# Patient Record
Sex: Male | Born: 1955 | Race: Black or African American | Hispanic: No | Marital: Married | Smoking: Never smoker
Health system: Southern US, Community
[De-identification: ages and names within clinical notes are randomized; demographics above are authoritative.]

## PROBLEM LIST (undated history)

## (undated) DIAGNOSIS — Z789 Other specified health status: Secondary | ICD-10-CM

## (undated) HISTORY — PX: NO PAST SURGERIES: SHX2092

---

## 2008-06-13 ENCOUNTER — Other Ambulatory Visit: Payer: Self-pay

## 2008-06-14 ENCOUNTER — Other Ambulatory Visit: Payer: Self-pay

## 2008-06-14 ENCOUNTER — Observation Stay: Payer: Self-pay | Admitting: Internal Medicine

## 2008-12-25 IMAGING — US US RENAL KIDNEY
1 series · 17 of 25 positions shown · non-contrast
Comparison: none

REASON FOR EXAM: erythrocytosis
COMMENTS:

[Series 1: us renal kidney · 17 of 25 slices shown]
[im 1/25]
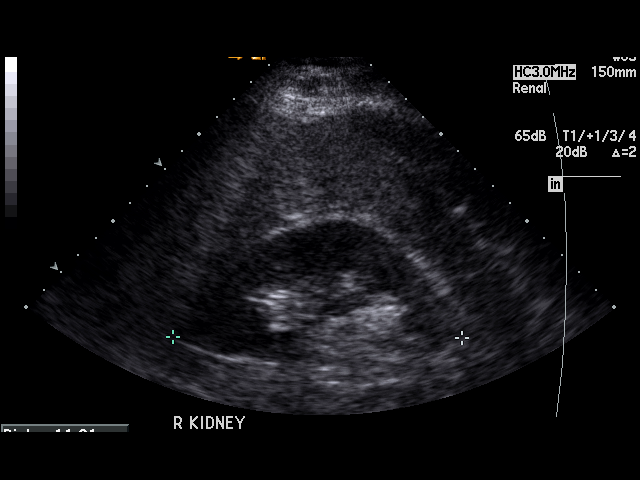
[im 3/25]
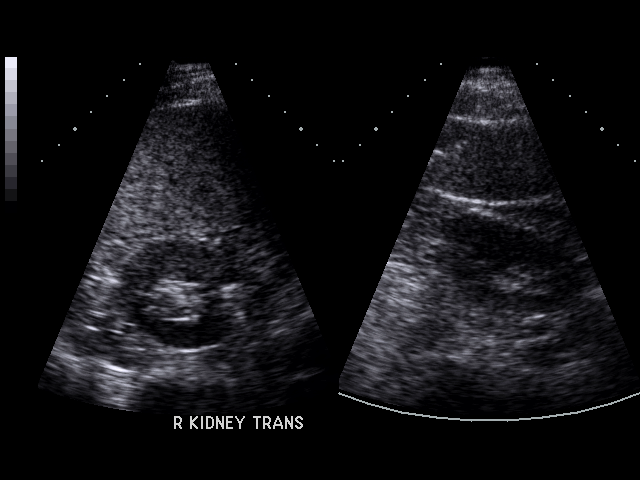
[im 4/25]
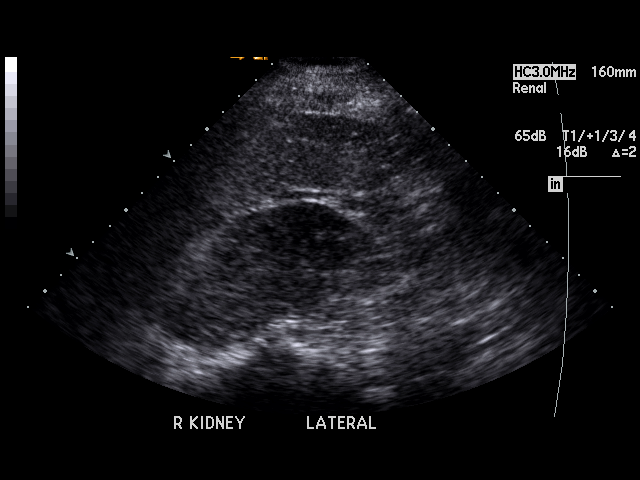
[im 6/25]
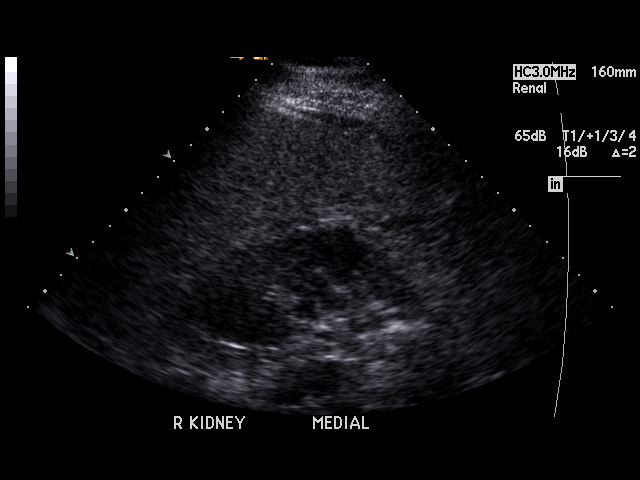
[im 7/25]
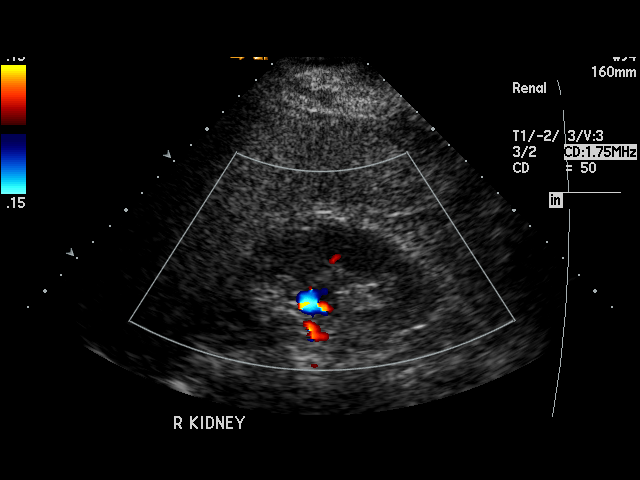
[im 9/25]
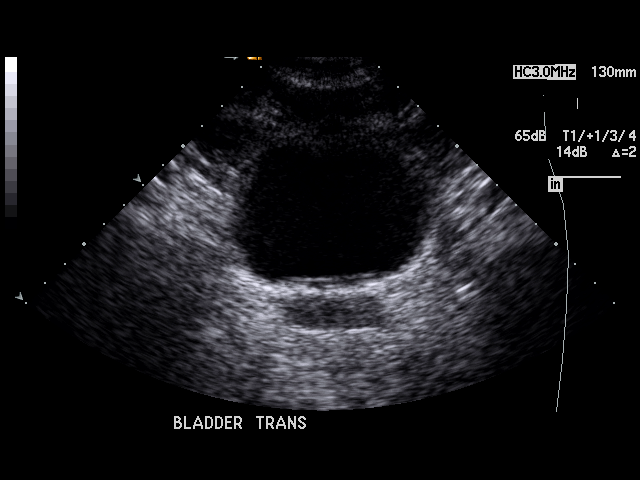
[im 10/25]
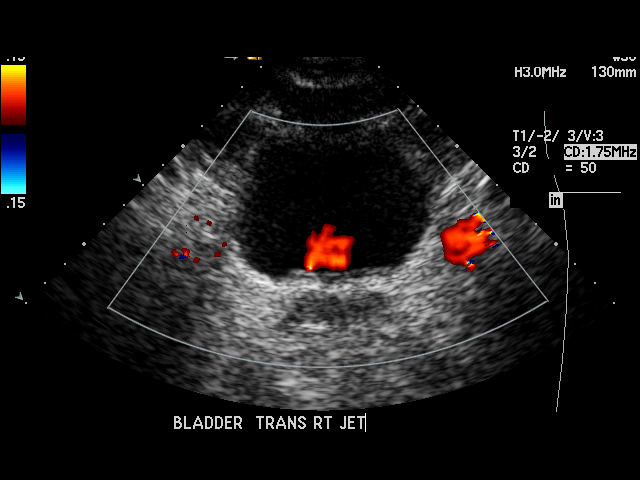
[im 12/25]
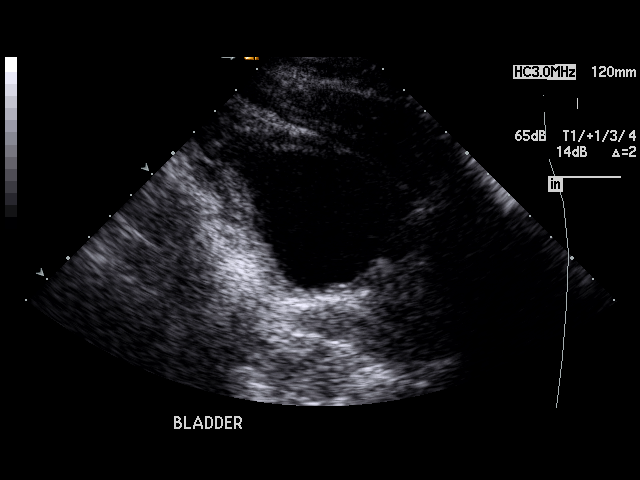
[im 13/25]
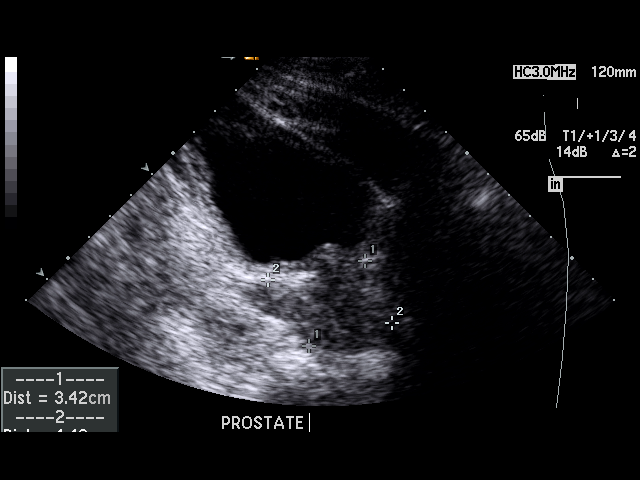
[im 14/25]
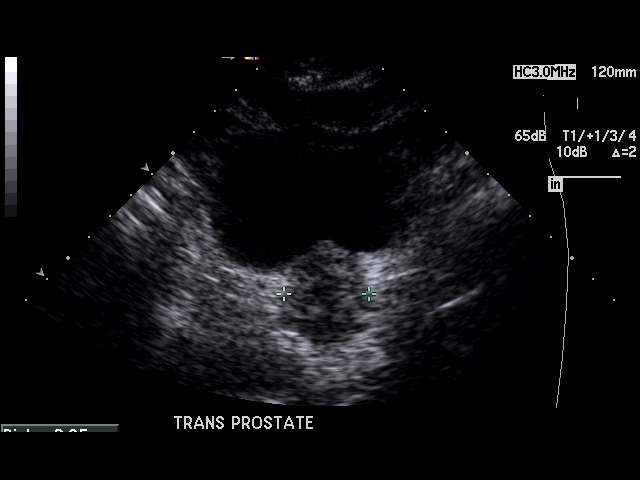
[im 16/25]
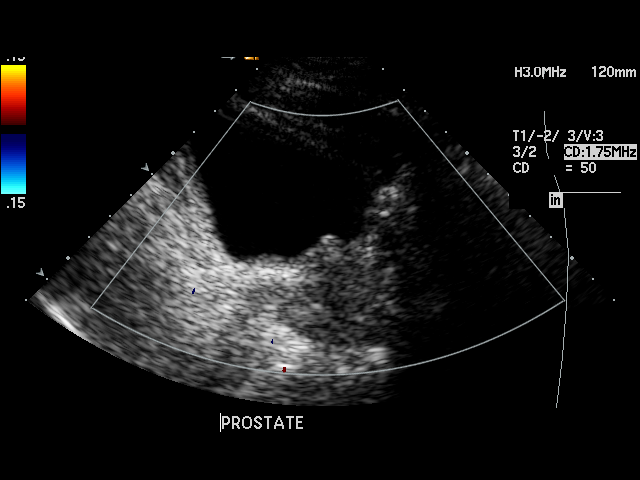
[im 17/25]
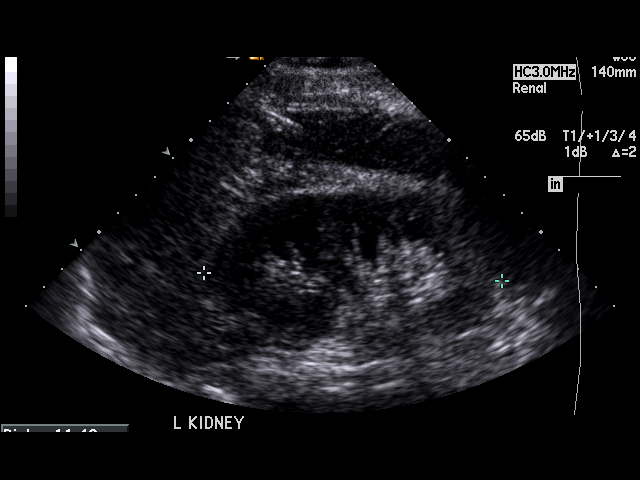
[im 19/25]
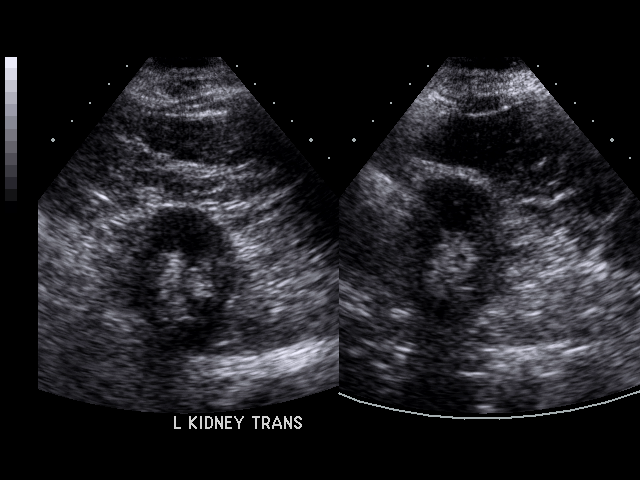
[im 20/25]
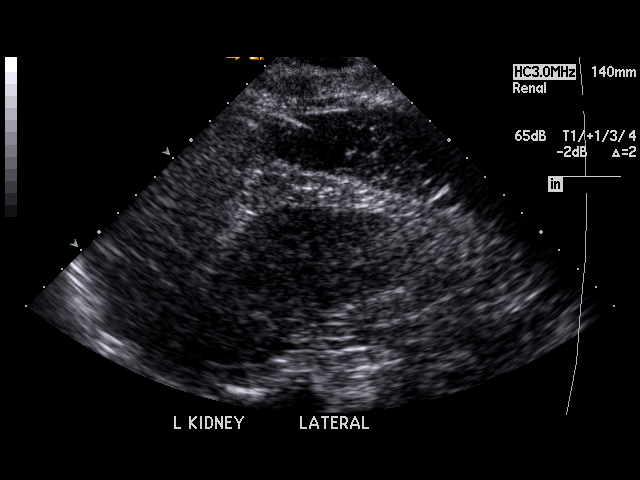
[im 22/25]
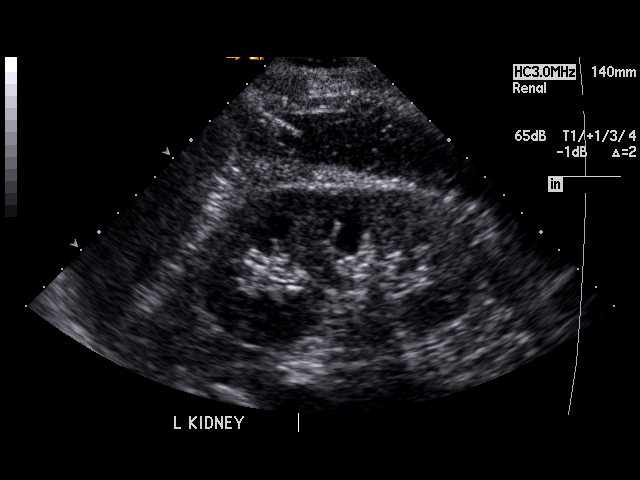
[im 23/25]
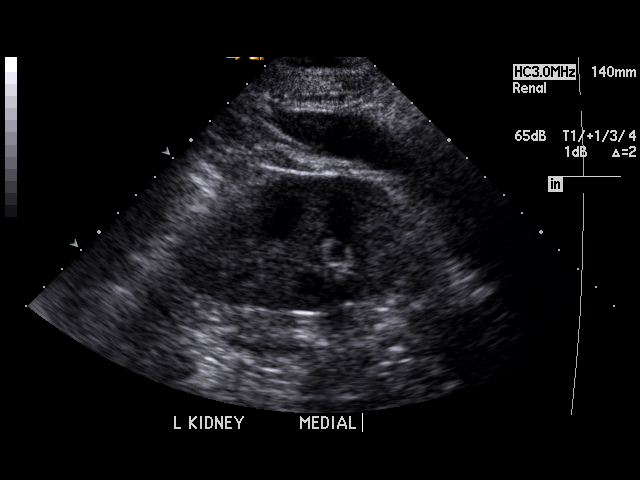
[im 25/25]
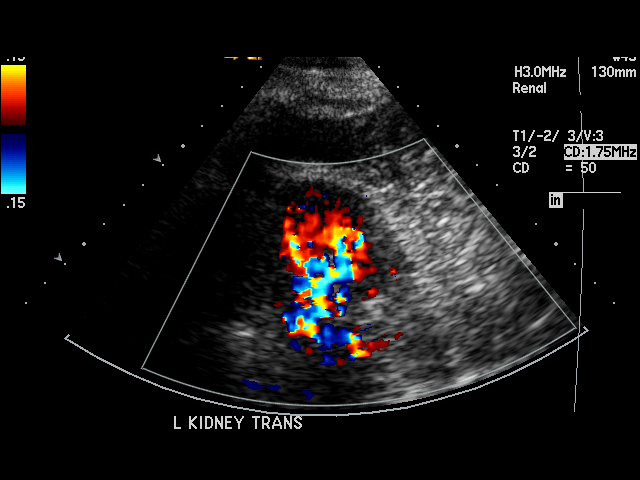

[17 of 25 positions shown; findings below may reference images not displayed]

PROCEDURE:     US  - US KIDNEY  - June 14, 2008 [DATE]

RESULT:     The RIGHT kidney measures 11.81 cm x 5.15 cm x 5.56 cm, and the
LEFT kidney measures 11.43 cm x 4.76 cm x 5.73 cm.  The renal cortical
margins are smooth. No solid or cystic renal mass lesions are seen. No renal
calcifications are identified. There is no hydronephrosis. The visualized
portion of the urinary bladder is normal in appearance. There is noted mild
indentation of the bladder base by the prostate. On this exam, the prostate
measures approximately 4.4 cm x 3.42 cm x 2.85 cm.
IMPRESSION: Normal study except for indentation of the base of the
bladder by a prominent prostate or prostate nodule.

## 2016-11-17 ENCOUNTER — Encounter: Payer: Self-pay | Admitting: *Deleted

## 2016-11-17 ENCOUNTER — Emergency Department
Admission: EM | Admit: 2016-11-17 | Discharge: 2016-11-18 | Disposition: A | Discharge status: Home / Self Care | Payer: BLUE CROSS/BLUE SHIELD | Attending: Emergency Medicine | Admitting: Emergency Medicine

## 2016-11-17 DIAGNOSIS — K922 Gastrointestinal hemorrhage, unspecified: Secondary | ICD-10-CM

## 2016-11-17 DIAGNOSIS — R109 Unspecified abdominal pain: Secondary | ICD-10-CM | POA: Diagnosis present

## 2016-11-17 LAB — CBC
HCT: 45.5 % (ref 40.0–52.0)
Hemoglobin: 15.8 g/dL (ref 13.0–18.0)
MCH: 32.6 pg (ref 26.0–34.0)
MCHC: 34.7 g/dL (ref 32.0–36.0)
MCV: 93.9 fL (ref 80.0–100.0)
PLATELETS: 176 10*3/uL (ref 150–440)
RBC: 4.85 MIL/uL (ref 4.40–5.90)
RDW: 13.7 % (ref 11.5–14.5)
WBC: 12.2 10*3/uL — ABNORMAL HIGH (ref 3.8–10.6)

## 2016-11-17 LAB — COMPREHENSIVE METABOLIC PANEL
ALK PHOS: 56 U/L (ref 38–126)
ALT: 28 U/L (ref 17–63)
AST: 51 U/L — ABNORMAL HIGH (ref 15–41)
Albumin: 3.7 g/dL (ref 3.5–5.0)
Anion gap: 9 (ref 5–15)
BILIRUBIN TOTAL: 1.1 mg/dL (ref 0.3–1.2)
BUN: 22 mg/dL — ABNORMAL HIGH (ref 6–20)
CALCIUM: 9 mg/dL (ref 8.9–10.3)
CO2: 24 mmol/L (ref 22–32)
CREATININE: 0.92 mg/dL (ref 0.61–1.24)
Chloride: 102 mmol/L (ref 101–111)
Glucose, Bld: 148 mg/dL — ABNORMAL HIGH (ref 65–99)
Potassium: 3.6 mmol/L (ref 3.5–5.1)
SODIUM: 135 mmol/L (ref 135–145)
TOTAL PROTEIN: 7.7 g/dL (ref 6.5–8.1)

## 2016-11-17 LAB — TROPONIN I

## 2016-11-17 NOTE — ED Notes (Signed)
Pt reports dark stool, reports taken ibuprofen often. Denies taking blood thinner or iron.

## 2016-11-17 NOTE — ED Triage Notes (Signed)
Pt reports abd pain with cramping.  Pt states dark colored stools today and noticed blood in emesis.  No dizziness.  Pt drinks etoh everyday. Pt alert.  Speech clear.

## 2016-11-18 ENCOUNTER — Emergency Department: Payer: BLUE CROSS/BLUE SHIELD

## 2016-11-18 MED ORDER — IOPAMIDOL (ISOVUE-300) INJECTION 61%
100.0000 mL | Freq: Once | INTRAVENOUS | Status: AC | PRN
  Administered 2016-11-18: 100 mL via INTRAVENOUS

## 2016-11-18 MED ORDER — IOPAMIDOL (ISOVUE-300) INJECTION 61%
30.0000 mL | Freq: Once | INTRAVENOUS | Status: AC | PRN
  Administered 2016-11-18: 30 mL via ORAL

## 2016-11-18 MED ORDER — PANTOPRAZOLE SODIUM 40 MG PO TBEC: 40.0000 mg | DELAYED_RELEASE_TABLET | Freq: Every day | ORAL | 1 refills | Status: DC

## 2016-11-18 NOTE — ED Notes (Signed)
Hemoccult, via rectal collected and tested, positive for blood. Dr. Manson PasseyBrown made aware.

## 2016-11-18 NOTE — ED Provider Notes (Signed)
[facility] Emergency Department Provider Note    First MD Initiated Contact with Patient 11/17/16 2346     (approximate)  I have reviewed the triage vital signs and the nursing notes.   HISTORY  Chief Complaint Abdominal Pain; Emesis; and Rectal Bleeding   HPI Marvin Sexton is a 61 y.o. male with no past medical history presents to the emergency department with abdominal cramping and dark stools and streaks of red blood in his emesis which he noted today. Patient does admit to daily EtOH ingestion ranging from 12-24 beers per day. Patient denies any history of cirrhosis of the liver or any other medical conditions.   Past medical history None There are no active problems to display for this patient.   Past surgical history None Prior to Admission medications   Medication Sig Start Date End Date Taking? Authorizing Provider  ibuprofen (ADVIL,MOTRIN) 200 MG tablet Take 200 mg by mouth every 6 (six) hours as needed for fever or mild pain.   Yes Historical Provider, MD    Allergies No known drug allergies History reviewed. No pertinent family history.  Social History Social History  Substance Use Topics  . Smoking status: Never Smoker  . Smokeless tobacco: Never Used  . Alcohol use Yes    Review of Systems Constitutional: No fever/chills Eyes: No visual changes. ENT: No sore throat. Cardiovascular: Denies chest pain. Respiratory: Denies shortness of breath. Gastrointestinal:Positive for abdominal cramps dark stools emesis with blood streaks.  Genitourinary: Negative for dysuria. Musculoskeletal: Negative for back pain. Skin: Negative for rash. Neurological: Negative for headaches, focal weakness or numbness.  10-point ROS otherwise negative.  ____________________________________________   PHYSICAL EXAM:  VITAL SIGNS: ED Triage Vitals  Enc Vitals Group     BP 11/17/16 2038 113/78     Pulse Rate 11/17/16 2038 (!) 125     Resp  11/17/16 2038 20     Temp 11/17/16 2038 98 F (36.7 C)     Temp Source 11/17/16 2038 Oral     SpO2 11/17/16 2038 98 %     Weight 11/17/16 2038 213 lb (96.6 kg)     Height 11/17/16 2038 5\' 11"  (1.803 m)     Head Circumference --      Peak Flow --      Pain Score 11/17/16 2039 5     Pain Loc --      Pain Edu? --      Excl. in GC? --     Constitutional: Alert and oriented. Well appearing and in no acute distress. Eyes: Conjunctivae are normal. PERRL. EOMI. Head: Atraumatic. Mouth/Throat: Mucous membranes are moist.  Oropharynx non-erythematous. Neck: No stridor.   Cardiovascular: Normal rate, regular rhythm. Good peripheral circulation. Grossly normal heart sounds. Respiratory: Normal respiratory effort.  No retractions. Lungs CTAB. Gastrointestinal: Soft and nontender. No distention. Guaiac positive stools Musculoskeletal: No lower extremity tenderness nor edema. No gross deformities of extremities. Neurologic:  Normal speech and language. No gross focal neurologic deficits are appreciated.  Skin:  Skin is warm, dry and intact. No rash noted. Psychiatric: Mood and affect are normal. Speech and behavior are normal.*  ____________________________________________   LABS (all labs ordered are listed, but only abnormal results are displayed)  Labs Reviewed  COMPREHENSIVE METABOLIC PANEL - Abnormal; Notable for the following:       Result Value   Glucose, Bld 148 (*)    BUN 22 (*)    AST 51 (*)    All other  components within normal limits  CBC - Abnormal; Notable for the following:    WBC 12.2 (*)    All other components within normal limits  TROPONIN I   ____________________________________________  EKG ED ECG REPORT I, Trenton N BROWN, the attending physician, personally viewed and interpreted this ECG.   Date: 11/19/2016  EKG Time: 8:38 PM  Rate: 113  Rhythm: Sinus tachycardia  Axis: Normal  Intervals: Normal  ST&T Change:  None  ____________________________________________  RADIOLOGY I, Jay N BROWN, personally viewed and evaluated these images (plain radiographs) as part of my medical decision making, as well as reviewing the written report by the radiologist.  Ct Abdomen Pelvis W Contrast  Result Date: 11/18/2016 CLINICAL DATA:  Abdominal pain with melena and hematemesis. EXAM: CT ABDOMEN AND PELVIS WITH CONTRAST TECHNIQUE: Multidetector CT imaging of the abdomen and pelvis was performed using the standard protocol following bolus administration of intravenous contrast. CONTRAST:  ISOVUE-300 IOPAMIDOL (ISOVUE-300) INJECTION 61% COMPARISON:  None. FINDINGS: Lower chest: Mild dependent atelectasis. No pleural fluid or focal airspace disease. Hepatobiliary: Mild decreased hepatic density suggesting steatosis. Tiny subcentimeter hypodensity in the inferior right lobe, too small to characterize. Gallbladder physiologically distended, no calcified stone. No biliary dilatation. Pancreas: No ductal dilatation or inflammation. Spleen: Normal in size without focal abnormality. Adrenals/Urinary Tract: Normal adrenal glands. No hydronephrosis or perinephric edema. Small cyst in the posterior right kidney. Ureters are decompressed. Urinary bladder is physiologically distended. Possible wall thickening at the dome. Stomach/Bowel: Minimal enteric contrast in the distal esophagus. No esophageal wall thickening. Stomach physiologically distended. No small bowel obstruction or inflammation. Normal appendix. Colon is relatively decompressed, possible mild wall thickening involving the distal descending colon. No definite perienteric inflammation. No significant diverticular disease. Vascular/Lymphatic: There is a circumaortic left renal vein. Normal caliber abdominal aorta. Trace atherosclerosis distally. No adenopathy. Reproductive: Prominent prostate gland causing mass effect on the bladder base. Other: Fat within the left  inguinal canal. No free air, free fluid, or intra-abdominal fluid collection. Musculoskeletal: Degenerative disc disease at L1-L2. There are no acute or suspicious osseous abnormalities. IMPRESSION: 1. Findings equivocal for colonic wall thickening versus nondistention of the distal descending colon, possible mild colitis. 2. Minimal enteric contrast in the distal esophagus can be seen with reflux or slow transit. No esophageal thickening or findings to explain hematemesis. 3. Incidental findings of enlarged prostate gland. Suspect mild bladder dome thickening which may be chronic and secondary to chronic bladder outlet obstruction. Small fat containing left inguinal hernia. Electronically Signed   By: Rubye Oaks M.D.   On: 11/18/2016 01:50      Procedures     INITIAL IMPRESSION / ASSESSMENT AND PLAN / ED COURSE  Pertinent labs & imaging results that were available during my care of the patient were reviewed by me and considered in my medical decision making (see chart for details).    He 66-year-old male presenting to the emergency department with guaiac positive stools blood streaks in his emesis. Patient is approximately 12-24 beers per day and a such concern for possible variceal bleeding. Patient had no episodes of vomiting while in the emergency department. CT scan patient's abdomen and pelvis revealed have mentioned. Patient hemoglobin and hematocrit 15.8 and 45.5. Patient no longer tachycardic with a heart rate of 92 at bedside at present. Discussed with the patient at length regarding his alcohol ingestion and referred the patient to the gastroenterologist for further outpatient evaluation and management.    ____________________________________________  FINAL CLINICAL IMPRESSION(S) / ED  DIAGNOSES  Final diagnoses:  Lower GI bleed     MEDICATIONS GIVEN DURING THIS VISIT:  Medications  iopamidol (ISOVUE-300) 61 % injection 30 mL (30 mLs Oral Contrast Given 11/18/16 0041)   iopamidol (ISOVUE-300) 61 % injection 100 mL (100 mLs Intravenous Contrast Given 11/18/16 0112)     NEW OUTPATIENT MEDICATIONS STARTED DURING THIS VISIT:  New Prescriptions   No medications on file    Modified Medications   No medications on file    Discontinued Medications   No medications on file     Note:  This document was prepared using Dragon voice recognition software and may include unintentional dictation errors.    Darci Currentandolph N Brown, MD 11/19/16 [phone]

## 2016-11-22 ENCOUNTER — Emergency Department
Admission: EM | Admit: 2016-11-22 | Discharge: 2016-11-22 | Disposition: A | Discharge status: Home / Self Care | Payer: BLUE CROSS/BLUE SHIELD | Attending: Emergency Medicine | Admitting: Emergency Medicine

## 2016-11-22 ENCOUNTER — Emergency Department: Payer: BLUE CROSS/BLUE SHIELD

## 2016-11-22 ENCOUNTER — Encounter: Payer: Self-pay | Admitting: Emergency Medicine

## 2016-11-22 DIAGNOSIS — Y929 Unspecified place or not applicable: Secondary | ICD-10-CM | POA: Insufficient documentation

## 2016-11-22 DIAGNOSIS — X58XXXA Exposure to other specified factors, initial encounter: Secondary | ICD-10-CM | POA: Insufficient documentation

## 2016-11-22 DIAGNOSIS — S39012A Strain of muscle, fascia and tendon of lower back, initial encounter: Secondary | ICD-10-CM | POA: Insufficient documentation

## 2016-11-22 DIAGNOSIS — Y939 Activity, unspecified: Secondary | ICD-10-CM | POA: Diagnosis not present

## 2016-11-22 DIAGNOSIS — M5441 Lumbago with sciatica, right side: Secondary | ICD-10-CM | POA: Insufficient documentation

## 2016-11-22 DIAGNOSIS — M545 Low back pain: Secondary | ICD-10-CM | POA: Diagnosis present

## 2016-11-22 DIAGNOSIS — Y990 Civilian activity done for income or pay: Secondary | ICD-10-CM | POA: Diagnosis not present

## 2016-11-22 DIAGNOSIS — M5431 Sciatica, right side: Secondary | ICD-10-CM

## 2016-11-22 MED ORDER — KETOROLAC TROMETHAMINE 60 MG/2ML IM SOLN
30.0000 mg | Freq: Once | INTRAMUSCULAR | Status: AC
  Administered 2016-11-22: 30 mg via INTRAMUSCULAR
  Filled 2016-11-22: qty 2

## 2016-11-22 MED ORDER — HYDROCODONE-ACETAMINOPHEN 5-325 MG PO TABS: 1.0000 | ORAL_TABLET | Freq: Three times a day (TID) | ORAL | 0 refills | Status: DC | PRN

## 2016-11-22 MED ORDER — CYCLOBENZAPRINE HCL 5 MG PO TABS: 5.0000 mg | ORAL_TABLET | Freq: Three times a day (TID) | ORAL | 0 refills | Status: DC | PRN

## 2016-11-22 MED ORDER — ORPHENADRINE CITRATE 30 MG/ML IJ SOLN
60.0000 mg | INTRAMUSCULAR | Status: AC
  Administered 2016-11-22: 60 mg via INTRAMUSCULAR
  Filled 2016-11-22: qty 2

## 2016-11-22 NOTE — ED Triage Notes (Signed)
Pt presents to ED c/o lower right back pain to his hip x1 day. Pt states he is now "unable to walk and hurts to sit". Pt unable to recall any recent injuries.

## 2016-11-22 NOTE — ED Notes (Signed)

## 2016-11-22 NOTE — Discharge Instructions (Signed)
Take the prescription meds as directed. Take OTC Tylenol for non-drowsy pain relief. Apply ice and moist heat to the back for comfort. Follow-up with your provider or Dr. Robbie LisH. Miller for continued symptoms. Return as needed.

## 2016-11-22 NOTE — ED Provider Notes (Signed)
[facility] Emergency Department Provider Note ____________________________________________  Time seen: 1409  I have reviewed the triage vital signs and the nursing notes.  HISTORY  Chief Complaint  Back Pain  HPI Marvin Sexton is a 61 y.o. male present to the ED for evaluation of acute low back pain with right lower extremity referral. Patient denies any recent injury, accident, or trauma. He does note that his work is physical in nature, but is unaware of any overuse syndrome. He reports onset of pain yesterday. While walking across the parking lot. Since that time he's had constant, dull, pain to lower back that he describes as "like a toothache." He describes pain is increased with attempts to stand and walk. At rest he has primarily pain to the right lower back. With movement and pain refers in his right lower extremity posteriorly to the knee. He denies any foot drop, bladder or bowel incontinence, or history of chronic and ongoing back pain. He has taken ibuprofen and 2 pills of Vicodin provided by his wife. Of note was that the patient was recently evaluated for hematemesis and hematochezia. The source of his GI bleed was not determined during the visit, and he is scheduled to see a GI provided for endoscopy study in 2 weeks. For that reason he should not be dosing anti-inflammatories by mouth.  History reviewed. No pertinent past medical history.  There are no active problems to display for this patient.  History reviewed. No pertinent surgical history.  Prior to Admission medications   Medication Sig Start Date End Date Taking? Authorizing Provider  cyclobenzaprine (FLEXERIL) 5 MG tablet Take 1 tablet (5 mg total) by mouth 3 (three) times daily as needed for muscle spasms. 11/22/16   Albirta Rhinehart V Bacon Nahima Ales, PA-C  HYDROcodone-acetaminophen (NORCO) 5-325 MG tablet Take 1 tablet by mouth 3 (three) times daily as needed. 11/22/16   Kajsa Butrum V Bacon Romualdo Prosise, PA-C   ibuprofen (ADVIL,MOTRIN) 200 MG tablet Take 200 mg by mouth every 6 (six) hours as needed for fever or mild pain.    Historical Provider, MD  pantoprazole (PROTONIX) 40 MG tablet Take 1 tablet (40 mg total) by mouth daily. 11/18/16 11/18/17  Darci Currentandolph N Brown, MD    Allergies Patient has no known allergies.  History reviewed. No pertinent family history.  Social History Social History  Substance Use Topics  . Smoking status: Never Smoker  . Smokeless tobacco: Never Used  . Alcohol use Yes    Review of Systems  Constitutional: Negative for fever. Cardiovascular: Negative for chest pain. Respiratory: Negative for shortness of breath. Gastrointestinal: Negative for abdominal pain, vomiting and diarrhea. Genitourinary: Negative for dysuria. Musculoskeletal: Positive for back pain. Skin: Negative for rash. Neurological: Negative for headaches, focal weakness or numbness. ____________________________________________  PHYSICAL EXAM:  VITAL SIGNS: ED Triage Vitals  Enc Vitals Group     BP 11/22/16 1245 109/62     Pulse Rate 11/22/16 1245 88     Resp 11/22/16 1245 18     Temp 11/22/16 1245 98.1 F (36.7 C)     Temp Source 11/22/16 1245 Oral     SpO2 11/22/16 1245 100 %     Weight 11/22/16 1245 213 lb (96.6 kg)     Height 11/22/16 1245 5\' 11"  (1.803 m)     Head Circumference --      Peak Flow --      Pain Score 11/22/16 1251 10     Pain Loc --  Pain Edu? --      Excl. in GC? --    Constitutional: Alert and oriented. Well appearing and in no distress. Head: Normocephalic and atraumatic. Cardiovascular: Normal rate, regular rhythm. Normal distal pulses. Respiratory: Normal respiratory effort. No wheezes/rales/rhonchi. Gastrointestinal: Soft and nontender. No distention. Musculoskeletal: Normal spinal alignment without midline tenderness, spasm, deformity, or step-off. Patient is to palpation to the right lumbar sacral region. He is able to demonstrate an negative seated  straight leg raise. Nontender with normal range of motion in all extremities.  Neurologic: Cranial nerves II through XII grossly intact. Normal LE DTRs bilaterally. Normal toe dorsiflexion and foot eversion is demonstrated. Normal speech and language. No gross focal neurologic deficits are appreciated. Skin:  Skin is warm, dry and intact. No rash noted. Psychiatric: Mood and affect are normal. Patient exhibits appropriate insight and judgment. ____________________________________________   RADIOLOGY  Lumbar Spine IMPRESSION: No acute findings. Mild degenerative change in the upper lumbar spine. Mild scoliosis which may be accentuated by patient positioning. ____________________________________________  PROCEDURES  Toradol 30 mg PO Norflex 60 mg IM ____________________________________________  INITIAL IMPRESSION / ASSESSMENT AND PLAN / ED COURSE  Patient reports improvement of his symptoms following IM administration of pain medicines in the ED. He is reassured by his x-ray exam finds at this time. He is discharged with a diagnosis of lumbar strain with right sided radicular symptoms consistent with sciatica. He will be discharged with prescriptions for hydrocodone and Flexeril doses directed. He is advised to follow his primary care provider for ongoing symptom management. He should return to the ED for acutely worsening symptoms as discussed. ____________________________________________  FINAL CLINICAL IMPRESSION(S) / ED DIAGNOSES  Final diagnoses:  Strain of lumbar region, initial encounter  Sciatica of right side      Lissa Hoard, PA-C 11/22/16 1847    Jeanmarie Plant, MD 11/22/16 2028

## 2016-12-15 ENCOUNTER — Other Ambulatory Visit: Payer: Self-pay

## 2016-12-15 ENCOUNTER — Encounter: Payer: Self-pay | Admitting: Gastroenterology

## 2016-12-15 ENCOUNTER — Ambulatory Visit (INDEPENDENT_AMBULATORY_CARE_PROVIDER_SITE_OTHER): Payer: BLUE CROSS/BLUE SHIELD | Admitting: Gastroenterology

## 2016-12-15 VITALS — BP 143/84 | HR 63 | Temp 98.3°F | Ht 71.0 in | Wt 214.0 lb

## 2016-12-15 DIAGNOSIS — K921 Melena: Secondary | ICD-10-CM | POA: Diagnosis not present

## 2016-12-15 DIAGNOSIS — R933 Abnormal findings on diagnostic imaging of other parts of digestive tract: Secondary | ICD-10-CM

## 2016-12-15 NOTE — Progress Notes (Signed)
Gastroenterology Consultation  Referring Provider:     Dr. Manson Sexton Primary Care Physician:  No PCP Per Patient Primary Gastroenterologist:  Marvin Sexton     Reason for Consultation:    Melena        HPI:   Marvin Sexton is a 61 y.o. y/o male referred for consultation & management of Melena by Marvin Sexton PCP Per Patient.  This patient comes today after being seen in the emergency room back on January 30 for rectal bleeding. The patient states he had black stools. The patient also has a history of alcohol abuse with 6 beers a day for the last 10 years but drank consistently to a lesser extent prior to that. The patient now reports that he has stopped drinking completely in his black stools have resolved. The patient did have a CT scan in the emergency room that showed him to have thickening of his colon which could be inflammation versus undistended colon. The patient denies any diarrhea or bloody stools. He also denies any previous colonoscopies. The patient did not have any signs of cirrhosis on his CT scan but it did report possible fatty liver. The patient also had a normal platelet and albumin on his blood tests with only a slightly increased AST.  History reviewed. No pertinent past medical history.  Past Surgical History:  Procedure Laterality Date  . NO PAST SURGERIES      Prior to Admission medications   Medication Sig Start Date End Date Taking? Authorizing Provider  pantoprazole (PROTONIX) 40 MG tablet Take 1 tablet (40 mg total) by mouth daily. 11/18/16 11/18/17 Yes Marvin Current, Marvin Sexton  cyclobenzaprine (FLEXERIL) 5 MG tablet Take 1 tablet (5 mg total) by mouth 3 (three) times daily as needed for muscle spasms. Patient not taking: Reported on 12/15/2016 11/22/16   Marvin Ivory Menshew, PA-C  HYDROcodone-acetaminophen (NORCO) 5-325 MG tablet Take 1 tablet by mouth 3 (three) times daily as needed. Patient not taking: Reported on 12/15/2016 11/22/16   Marvin Ivory Menshew, PA-C  ibuprofen  (ADVIL,MOTRIN) 200 MG tablet Take 200 mg by mouth every 6 (six) hours as needed for fever or mild pain.    Historical Provider, Marvin Sexton    Family History  Problem Relation Age of Onset  . Pancreatic cancer Father      Social History  Substance Use Topics  . Smoking status: Never Smoker  . Smokeless tobacco: Former Neurosurgeon  . Alcohol use No     Comment: former    Allergies as of 12/15/2016  . (No Known Allergies)    Review of Systems:    All systems reviewed and negative except where noted in HPI.   Physical Exam:  BP (!) 143/84   Pulse 63   Temp 98.3 F (36.8 C) (Oral)   Ht 5\' 11"  (1.803 m)   Wt 214 lb (97.1 kg)   BMI 29.85 kg/m  No LMP for male patient. Psych:  Alert and cooperative. Normal mood and affect. General:   Alert,  Well-developed, well-nourished, pleasant and cooperative in NAD Head:  Normocephalic and atraumatic. Eyes:  Sclera clear, no icterus.   Conjunctiva pink. Ears:  Normal auditory acuity. Nose:  No deformity, discharge, or lesions. Mouth:  No deformity or lesions,oropharynx pink & moist. Neck:  Supple; no masses or thyromegaly. Lungs:  Respirations even and unlabored.  Clear throughout to auscultation.   No wheezes, crackles, or rhonchi. No acute distress. Heart:  Regular rate and rhythm; no murmurs, clicks,  rubs, or gallops. Abdomen:  Normal bowel sounds.  No bruits.  Soft, non-tender and non-distended without masses, hepatosplenomegaly or hernias noted.  No guarding or rebound tenderness.  Negative Carnett sign.   Rectal:  Deferred.  Msk:  Symmetrical without gross deformities.  Good, equal movement & strength bilaterally. Pulses:  Normal pulses noted. Extremities:  No clubbing or edema.  No cyanosis. Neurologic:  Alert and oriented x3;  grossly normal neurologically. Skin:  Intact without significant lesions or rashes.  No jaundice. Lymph Nodes:  No significant cervical adenopathy. Psych:  Alert and cooperative. Normal mood and affect.  Imaging  Studies: Dg Lumbar Spine Complete  Result Date: 11/22/2016 CLINICAL DATA:  Low right back pain radiating to hip for 1 day. Unable to walk. EXAM: LUMBAR SPINE - COMPLETE 4+ VIEW COMPARISON:  None. FINDINGS: There is a mild levoscoliosis which may be accentuated by patient positioning. Alignment is otherwise normal. Bone mineralization is normal. No fracture line or displaced fracture fragment seen. No acute or suspicious osseous lesion. No evidence of pars interarticularis defect. Mild degenerative spurring within the upper lumbar spine. No evidence of advanced degenerative change at any level. Upper sacrum appears intact and normally aligned. Visualized paravertebral soft tissues are unremarkable. IMPRESSION: No acute findings. Mild degenerative change in the upper lumbar spine. Mild scoliosis which may be accentuated by patient positioning. Electronically Signed   By: Bary RichardStan  Maynard M.D.   On: 11/22/2016 15:33   Ct Abdomen Pelvis W Contrast  Result Date: 11/18/2016 CLINICAL DATA:  Abdominal pain with melena and hematemesis. EXAM: CT ABDOMEN AND PELVIS WITH CONTRAST TECHNIQUE: Multidetector CT imaging of the abdomen and pelvis was performed using the standard protocol following bolus administration of intravenous contrast. CONTRAST:  100mL ISOVUE-300 IOPAMIDOL (ISOVUE-300) INJECTION 61% COMPARISON:  None. FINDINGS: Lower chest: Mild dependent atelectasis. No pleural fluid or focal airspace disease. Hepatobiliary: Mild decreased hepatic density suggesting steatosis. Tiny subcentimeter hypodensity in the inferior right lobe, too small to characterize. Gallbladder physiologically distended, no calcified stone. No biliary dilatation. Pancreas: No ductal dilatation or inflammation. Spleen: Normal in size without focal abnormality. Adrenals/Urinary Tract: Normal adrenal glands. No hydronephrosis or perinephric edema. Small cyst in the posterior right kidney. Ureters are decompressed. Urinary bladder is  physiologically distended. Possible wall thickening at the dome. Stomach/Bowel: Minimal enteric contrast in the distal esophagus. No esophageal wall thickening. Stomach physiologically distended. No small bowel obstruction or inflammation. Normal appendix. Colon is relatively decompressed, possible mild wall thickening involving the distal descending colon. No definite perienteric inflammation. No significant diverticular disease. Vascular/Lymphatic: There is a circumaortic left renal vein. Normal caliber abdominal aorta. Trace atherosclerosis distally. No adenopathy. Reproductive: Prominent prostate gland causing mass effect on the bladder base. Other: Fat within the left inguinal canal. No free air, free fluid, or intra-abdominal fluid collection. Musculoskeletal: Degenerative disc disease at L1-L2. There are no acute or suspicious osseous abnormalities. IMPRESSION: 1. Findings equivocal for colonic wall thickening versus nondistention of the distal descending colon, possible mild colitis. 2. Minimal enteric contrast in the distal esophagus can be seen with reflux or slow transit. No esophageal thickening or findings to explain hematemesis. 3. Incidental findings of enlarged prostate gland. Suspect mild bladder dome thickening which may be chronic and secondary to chronic bladder outlet obstruction. Small fat containing left inguinal hernia. Electronically Signed   By: Rubye OaksMelanie  Ehinger M.D.   On: 11/18/2016 01:50    Assessment and Plan:   Marvin Sexton is a 61 y.o. y/o male who comes in today with  a history of black stools with a CT scan showing thickening of his colon wall. The patient has never had a colonoscopy in the past. The patient has recently stopped his alcohol abuse. The patient will be set up for an EGD and colonoscopy to look for a source of his abnormal CT scan and his black stools.I have discussed risks & benefits which include, but are not limited to, bleeding, infection, perforation & drug  reaction.  The patient agrees with this plan & written consent will be obtained.       Marvin Minium, Marvin Sexton. Clementeen Graham   Note: This dictation was prepared with Dragon dictation along with smaller phrase technology. Any transcriptional errors that result from this process are unintentional.

## 2016-12-16 ENCOUNTER — Other Ambulatory Visit: Payer: Self-pay

## 2016-12-16 DIAGNOSIS — K921 Melena: Secondary | ICD-10-CM

## 2016-12-22 ENCOUNTER — Encounter: Payer: Self-pay | Admitting: *Deleted

## 2016-12-23 NOTE — Discharge Instructions (Signed)

## 2016-12-24 ENCOUNTER — Ambulatory Visit: Payer: BLUE CROSS/BLUE SHIELD | Admitting: Anesthesiology

## 2016-12-24 ENCOUNTER — Encounter
Admission: RE | Disposition: A | Discharge status: Home / Self Care | Payer: Self-pay | Source: Ambulatory Visit | Attending: Gastroenterology

## 2016-12-24 ENCOUNTER — Ambulatory Visit
Admission: RE | Admit: 2016-12-24 | Discharge: 2016-12-24 | Disposition: A | Discharge status: Home / Self Care | Payer: BLUE CROSS/BLUE SHIELD | Source: Ambulatory Visit | Attending: Gastroenterology | Admitting: Gastroenterology

## 2016-12-24 DIAGNOSIS — R933 Abnormal findings on diagnostic imaging of other parts of digestive tract: Secondary | ICD-10-CM | POA: Insufficient documentation

## 2016-12-24 DIAGNOSIS — K259 Gastric ulcer, unspecified as acute or chronic, without hemorrhage or perforation: Secondary | ICD-10-CM | POA: Insufficient documentation

## 2016-12-24 DIAGNOSIS — K219 Gastro-esophageal reflux disease without esophagitis: Secondary | ICD-10-CM | POA: Insufficient documentation

## 2016-12-24 DIAGNOSIS — K648 Other hemorrhoids: Secondary | ICD-10-CM | POA: Insufficient documentation

## 2016-12-24 DIAGNOSIS — K2980 Duodenitis without bleeding: Secondary | ICD-10-CM | POA: Diagnosis not present

## 2016-12-24 DIAGNOSIS — K2950 Unspecified chronic gastritis without bleeding: Secondary | ICD-10-CM | POA: Insufficient documentation

## 2016-12-24 DIAGNOSIS — K921 Melena: Secondary | ICD-10-CM | POA: Insufficient documentation

## 2016-12-24 DIAGNOSIS — R198 Other specified symptoms and signs involving the digestive system and abdomen: Secondary | ICD-10-CM

## 2016-12-24 DIAGNOSIS — B9681 Helicobacter pylori [H. pylori] as the cause of diseases classified elsewhere: Secondary | ICD-10-CM | POA: Insufficient documentation

## 2016-12-24 HISTORY — DX: Other specified health status: Z78.9

## 2016-12-24 HISTORY — PX: COLONOSCOPY WITH PROPOFOL: SHX5780

## 2016-12-24 SURGERY — COLONOSCOPY WITH PROPOFOL
Anesthesia: Monitor Anesthesia Care | Wound class: Contaminated

## 2016-12-24 MED ORDER — LIDOCAINE HCL (CARDIAC) 20 MG/ML IV SOLN
INTRAVENOUS | Status: DC | PRN
  Administered 2016-12-24: 50 mg via INTRAVENOUS

## 2016-12-24 MED ORDER — GLYCOPYRROLATE 0.2 MG/ML IJ SOLN
INTRAMUSCULAR | Status: DC | PRN
  Administered 2016-12-24: 0.2 mg via INTRAVENOUS

## 2016-12-24 MED ORDER — STERILE WATER FOR IRRIGATION IR SOLN
Status: DC | PRN
  Administered 2016-12-24: 5 mL

## 2016-12-24 MED ORDER — OXYCODONE HCL 5 MG PO TABS: 5.0000 mg | ORAL_TABLET | Freq: Once | ORAL | Status: DC | PRN

## 2016-12-24 MED ORDER — LACTATED RINGERS IV SOLN
INTRAVENOUS | Status: DC
  Administered 2016-12-24: 08:00:00 via INTRAVENOUS

## 2016-12-24 MED ORDER — OXYCODONE HCL 5 MG/5ML PO SOLN: 5.0000 mg | Freq: Once | ORAL | Status: DC | PRN

## 2016-12-24 MED ORDER — PROPOFOL 10 MG/ML IV BOLUS
INTRAVENOUS | Status: DC | PRN
  Administered 2016-12-24 (×3): 50 mg via INTRAVENOUS
  Administered 2016-12-24: 100 mg via INTRAVENOUS
  Administered 2016-12-24 (×2): 50 mg via INTRAVENOUS

## 2016-12-24 SURGICAL SUPPLY — 23 items

## 2016-12-24 NOTE — Anesthesia Procedure Notes (Signed)
Procedure Name: MAC Performed by: Eyal Greenhaw Pre-anesthesia Checklist: Patient identified, Emergency Drugs available, Suction available, Timeout performed and Patient being monitored Patient Re-evaluated:Patient Re-evaluated prior to inductionOxygen Delivery Method: Nasal cannula Placement Confirmation: positive ETCO2     

## 2016-12-24 NOTE — Anesthesia Postprocedure Evaluation (Signed)
Anesthesia Post Note  Patient: Marvin Sexton  Procedure(s) Performed: Procedure(s) (LRB): COLONOSCOPY WITH PROPOFOL (N/A)  Patient location during evaluation: PACU Anesthesia Type: MAC Level of consciousness: awake and alert Pain management: pain level controlled Vital Signs Assessment: post-procedure vital signs reviewed and stable Respiratory status: spontaneous breathing, nonlabored ventilation, respiratory function stable and patient connected to nasal cannula oxygen Cardiovascular status: stable and blood pressure returned to baseline Anesthetic complications: no    Racquel Arkin

## 2016-12-24 NOTE — Op Note (Signed)
[facility] Patient Name: Marvin Sexton Procedure Date: 12/24/2016 8:02 AM MRN: [id number] Account #: [id number] Date of Birth: 04-Aug-1956 Admit Type: Outpatient Age: 61 Room: [facility] OR ROOM 01 Gender: Male Note Status: Finalized Procedure:            Colonoscopy Indications:          Abnormal CT of the GI tract Providers:            Midge Minium MD, MD Referring MD:         Duanne Limerick, MD (Referring MD) Medicines:            Propofol per Anesthesia Complications:        No immediate complications. Procedure:            Pre-Anesthesia Assessment:                       - Prior to the procedure, a History and Physical was                        performed, and patient medications and allergies were                        reviewed. The patient's tolerance of previous                        anesthesia was also reviewed. The risks and benefits of                        the procedure and the sedation options and risks were                        discussed with the patient. All questions were                        answered, and informed consent was obtained. Prior                        Anticoagulants: The patient has taken no previous                        anticoagulant or antiplatelet agents. ASA Grade                        Assessment: II - A patient with mild systemic disease.                        After reviewing the risks and benefits, the patient was                        deemed in satisfactory condition to undergo the                        procedure.                       After obtaining informed consent, the colonoscope was                        passed under direct vision. Throughout the procedure,  the patient's blood pressure, pulse, and oxygen                        saturations were monitored continuously. The Olympus                        Colonoscope [id number]) was introduced through the    anus and advanced to the the cecum, identified by                        appendiceal orifice and ileocecal valve. The                        colonoscopy was performed without difficulty. The                        patient tolerated the procedure well. The quality of                        the bowel preparation was excellent. Findings:      The perianal and digital rectal examinations were normal.      Non-bleeding internal hemorrhoids were found during retroflexion. Impression:           - Non-bleeding internal hemorrhoids.                       - No specimens collected. Recommendation:       - Discharge patient to home.                       - Resume previous diet.                       - Continue present medications.                       - Await pathology results. Procedure Code(s):    --- Professional ---                       [phone] [id number], Colonoscopy, flexible; diagnostic, including                        collection of specimen(s) by brushing or washing, when                        performed (separate procedure) Diagnosis Code(s):    --- Professional ---                       R93.3, Abnormal findings on diagnostic imaging of other                        parts of digestive tract CPT copyright 2016 American Medical Association. All rights reserved. The codes documented in this report are preliminary and upon coder review may  be revised to meet current compliance requirements. Midge Miniumarren Patrisia Faeth MD, MD 12/24/2016 8:54:47 AM This report has been signed electronically. Number of Addenda: 0 Note Initiated On: 12/24/2016 8:02 AM Scope Withdrawal Time: 0 hours 6 minutes 55 seconds  Total Procedure Duration: 0 hours 8 minutes 46 seconds       [facility]

## 2016-12-24 NOTE — Op Note (Signed)
[facility] Patient Name: Marvin Sexton Procedure Date: 12/24/2016 8:16 AM MRN: [id number] Account #: [id number] Date of Birth: 1956-05-24 Admit Type: Outpatient Age: 61 Room: [facility] OR ROOM 1 Gender: Male Note Status: Finalized Procedure:            Upper GI endoscopy Indications:          Melena Providers:            Midge Miniumarren Justn Quale MD, MD Referring MD:         Duanne Limerickeanna C. Linford, MD (Referring MD) Medicines:            Propofol per Anesthesia Complications:        No immediate complications. Procedure:            Pre-Anesthesia Assessment:                       - Prior to the procedure, a History and Physical was                        performed, and patient medications and allergies were                        reviewed. The patient's tolerance of previous                        anesthesia was also reviewed. The risks and benefits of                        the procedure and the sedation options and risks were                        discussed with the patient. All questions were                        answered, and informed consent was obtained. Prior                        Anticoagulants: The patient has taken no previous                        anticoagulant or antiplatelet agents. ASA Grade                        Assessment: II - A patient with mild systemic disease.                        After reviewing the risks and benefits, the patient was                        deemed in satisfactory condition to undergo the                        procedure.                       After obtaining informed consent, the endoscope was                        passed under direct vision. Throughout the procedure,  the patient's blood pressure, pulse, and oxygen                        saturations were monitored continuously. The Olympus                        190 Endoscope [id number] was introduced through the                        mouth, and  advanced to the second part of duodenum. The                        upper GI endoscopy was accomplished without difficulty.                        The patient tolerated the procedure well. Findings:      The examined esophagus was normal.      One non-bleeding cratered gastric ulcer with no stigmata of bleeding was       found in the gastric antrum. Biopsies were taken with a cold forceps for       Helicobacter pylori testing.      Localized moderate inflammation characterized by erythema was found in       the duodenal bulb. Impression:           - Normal esophagus.                       - Non-bleeding gastric ulcer with no stigmata of                        bleeding. Biopsied.                       - Duodenitis. Recommendation:       - Discharge patient to home.                       - Resume previous diet.                       - Continue present medications.                       - Await pathology results. Procedure Code(s):    --- Professional ---                       [phone], Esophagogastroduodenoscopy, flexible, transoral;                        with biopsy, single or multiple Diagnosis Code(s):    --- Professional ---                       K92.1, Melena (includes Hematochezia)                       K25.9, Gastric ulcer, unspecified as acute or chronic,                        without hemorrhage or perforation                       K29.80, Duodenitis without bleeding CPT  copyright 2016 American Medical Association. All rights reserved. The codes documented in this report are preliminary and upon coder review may  be revised to meet current compliance requirements. Midge Minium MD, MD 12/24/2016 8:38:38 AM This report has been signed electronically. Number of Addenda: 0 Note Initiated On: 12/24/2016 8:16 AM      [facility]

## 2016-12-24 NOTE — Anesthesia Preprocedure Evaluation (Signed)
Anesthesia Evaluation  Patient identified by MRN, date of birth, ID band  Reviewed: NPO status   History of Anesthesia Complications Negative for: history of anesthetic complications  Airway Mallampati: II  TM Distance: >3 FB Neck ROM: full    Dental  (+) Chipped, Poor Dentition, Missing,    Pulmonary neg pulmonary ROS,    Pulmonary exam normal        Cardiovascular Exercise Tolerance: Good negative cardio ROS Normal cardiovascular exam     Neuro/Psych negative neurological ROS  negative psych ROS   GI/Hepatic negative GI ROS, Neg liver ROS, GERD  Medicated,  Endo/Other  negative endocrine ROS  Renal/GU negative Renal ROS  negative genitourinary   Musculoskeletal   Abdominal   Peds  Hematology negative hematology ROS (+)   Anesthesia Other Findings   Reproductive/Obstetrics                             Anesthesia Physical Anesthesia Plan  ASA: II  Anesthesia Plan: MAC   Post-op Pain Management:    Induction:   Airway Management Planned:   Additional Equipment:   Intra-op Plan:   Post-operative Plan:   Informed Consent: I have reviewed the patients History and Physical, chart, labs and discussed the procedure including the risks, benefits and alternatives for the proposed anesthesia with the patient or authorized representative who has indicated his/her understanding and acceptance.     Plan Discussed with: CRNA  Anesthesia Plan Comments:         Anesthesia Quick Evaluation

## 2016-12-24 NOTE — Transfer of Care (Signed)
Immediate Anesthesia Transfer of Care Note  Patient: Marvin Sexton  Procedure(s) Performed: Procedure(s): COLONOSCOPY WITH PROPOFOL (N/A)  Patient Location: PACU  Anesthesia Type: MAC  Level of Consciousness: awake, alert  and patient cooperative  Airway and Oxygen Therapy: Patient Spontanous Breathing and Patient connected to supplemental oxygen  Post-op Assessment: Post-op Vital signs reviewed, Patient's Cardiovascular Status Stable, Respiratory Function Stable, Patent Airway and No signs of Nausea or vomiting  Post-op Vital Signs: Reviewed and stable  Complications: No apparent anesthesia complications

## 2016-12-24 NOTE — H&P (Signed)
  Marvin Miniumarren Ader Fritze, MD [facility] [address] [place], [place] [address] Phone: [phone] Fax : [phone]  Primary Care Physician:  No PCP Per Patient Primary Gastroenterologist:  Dr. Servando SnareWohl  Pre-Procedure History & Physical: HPI:  Marvin Sexton is a 61 y.o. male is here for an endoscopy and colonoscopy.   Past Medical History:  Diagnosis Date  . Medical history non-contributory     Past Surgical History:  Procedure Laterality Date  . NO PAST SURGERIES      Prior to Admission medications   Medication Sig Start Date End Date Taking? Authorizing Provider  ibuprofen (ADVIL,MOTRIN) 200 MG tablet Take 200 mg by mouth every 6 (six) hours as needed for fever or mild pain.   Yes Historical Provider, MD  pantoprazole (PROTONIX) 40 MG tablet Take 1 tablet (40 mg total) by mouth daily. 11/18/16 11/18/17 Yes Darci Currentandolph N Brown, MD    Allergies as of 12/16/2016  . (No Known Allergies)    Family History  Problem Relation Age of Onset  . Pancreatic cancer Father     Social History   Social History  . Marital status: Married    Spouse name: N/A  . Number of children: N/A  . Years of education: N/A   Occupational History  . Not on file.   Social History Main Topics  . Smoking status: Never Smoker  . Smokeless tobacco: Former NeurosurgeonUser    Types: Chew  . Alcohol use No     Comment: former  . Drug use: No  . Sexual activity: Yes   Other Topics Concern  . Not on file   Social History Narrative  . No narrative on file    Review of Systems: See HPI, otherwise negative ROS  Physical Exam: BP 115/80   Pulse 61   Temp 97.9 F (36.6 C) (Temporal)   Ht 5\' 11"  (1.803 m)   Wt 203 lb (92.1 kg)   BMI 28.31 kg/m  General:   Alert,  pleasant and cooperative in NAD Head:  Normocephalic and atraumatic. Neck:  Supple; no masses or thyromegaly. Lungs:  Clear throughout to auscultation.    Heart:  Regular rate and rhythm. Abdomen:  Soft, nontender and nondistended. Normal bowel  sounds, without guarding, and without rebound.   Neurologic:  Alert and  oriented x4;  grossly normal neurologically.  Impression/Plan: Marvin Sexton is here for an endoscopy and colonoscopy to be performed for melena and abnormal CT scan  Risks, benefits, limitations, and alternatives regarding  endoscopy and colonoscopy have been reviewed with the patient.  Questions have been answered.  All parties agreeable.   Marvin Miniumarren Niquita Digioia, MD  12/24/2016, 8:13 AM

## 2016-12-25 ENCOUNTER — Encounter: Payer: Self-pay | Admitting: Gastroenterology

## 2016-12-28 ENCOUNTER — Encounter: Payer: Self-pay | Admitting: Gastroenterology

## 2017-01-04 ENCOUNTER — Other Ambulatory Visit: Payer: Self-pay

## 2017-01-04 MED ORDER — CLARITHROMYCIN 500 MG PO TABS: 500.0000 mg | ORAL_TABLET | Freq: Two times a day (BID) | ORAL | 0 refills | Status: AC

## 2017-01-04 MED ORDER — PANTOPRAZOLE SODIUM 40 MG PO TBEC: 40.0000 mg | DELAYED_RELEASE_TABLET | Freq: Every day | ORAL | 2 refills | Status: DC

## 2017-01-04 MED ORDER — AMOXICILLIN 500 MG PO CAPS: 1000.0000 mg | ORAL_CAPSULE | Freq: Two times a day (BID) | ORAL | 0 refills | Status: AC

## 2017-02-12 ENCOUNTER — Other Ambulatory Visit: Payer: Self-pay

## 2017-02-26 ENCOUNTER — Other Ambulatory Visit: Payer: Self-pay

## 2017-02-26 MED ORDER — PANTOPRAZOLE SODIUM 40 MG PO TBEC: 40.0000 mg | DELAYED_RELEASE_TABLET | Freq: Every day | ORAL | 3 refills | Status: AC

## 2017-05-31 IMAGING — CT CT ABD-PELV W/ CM
2 of 5 series · 15 of 46 positions shown, 17 images · IV contrast (APPLIED)
Comparison: None.

CLINICAL DATA: Abdominal pain with melena and hematemesis.

EXAM:
CT ABDOMEN AND PELVIS WITH CONTRAST
TECHNIQUE: Multidetector CT imaging of the abdomen and pelvis was performed
using the standard protocol following bolus administration of
intravenous contrast.
CONTRAST:  100mL [id number] IOPAMIDOL ([id number]) INJECTION 61%

[Series 2: routine abd/pel with · axial · 0.86mm/px · z∈[-856,-422]mm · 12 of 99 slices shown, 14 images]
[im 6/99  soft-tissue]
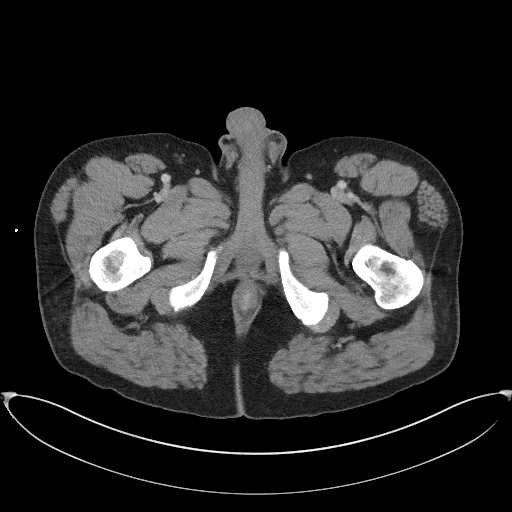
[im 6/99  bone]
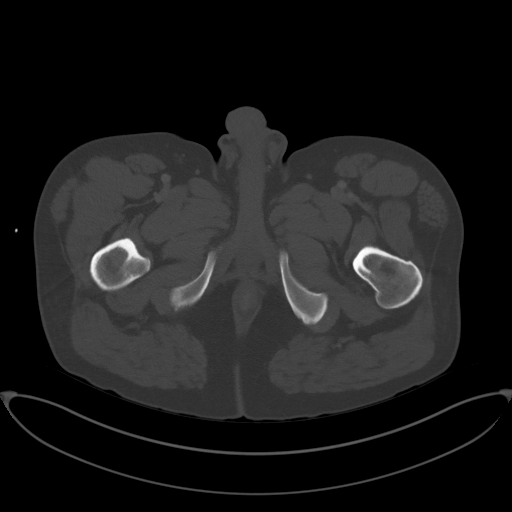
[im 17/99  soft-tissue]
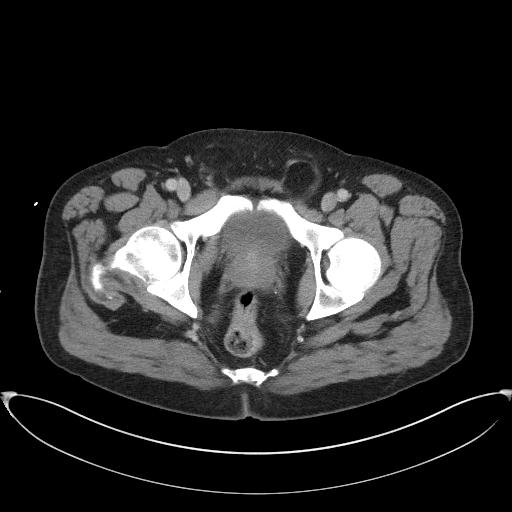
[im 22/99  soft-tissue]
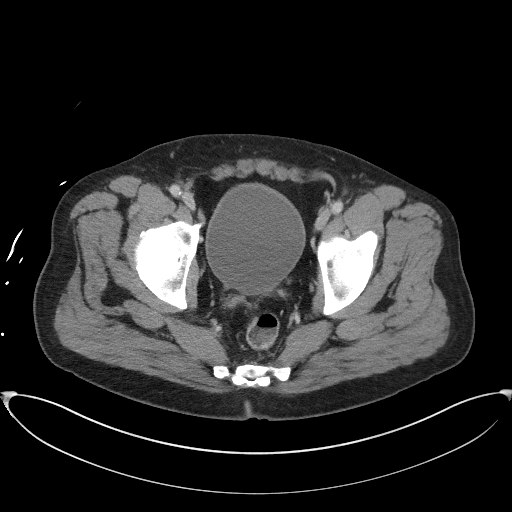
[im 28/99  soft-tissue]
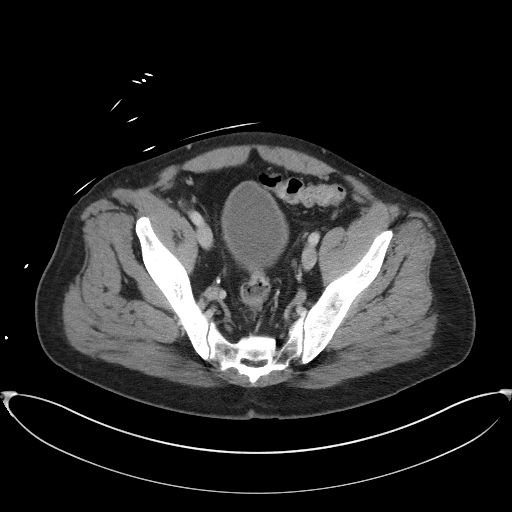
[im 39/99  soft-tissue]
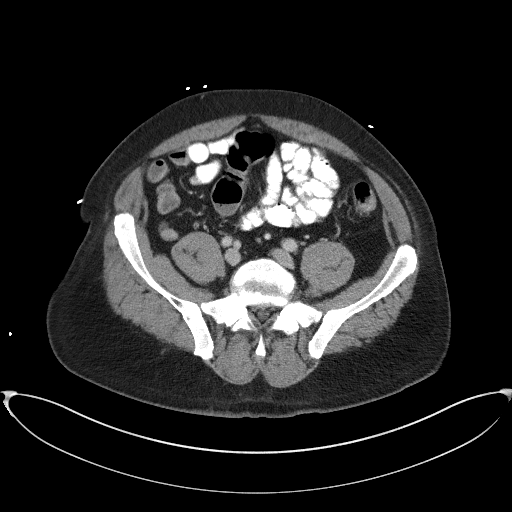
[im 44/99  soft-tissue]
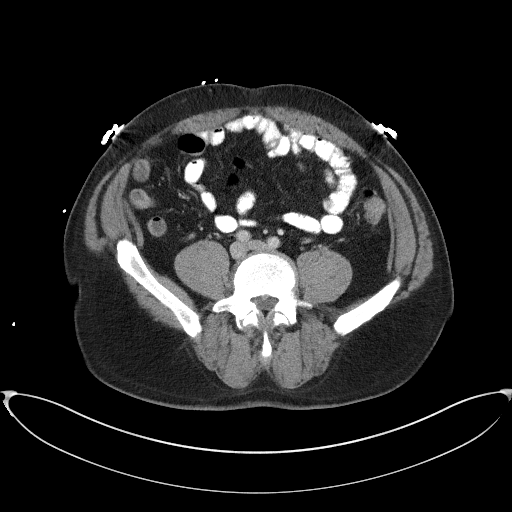
[im 55/99  soft-tissue]
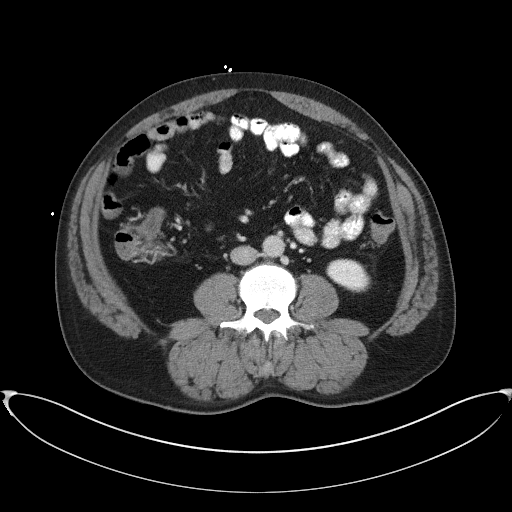
[im 60/99  soft-tissue]
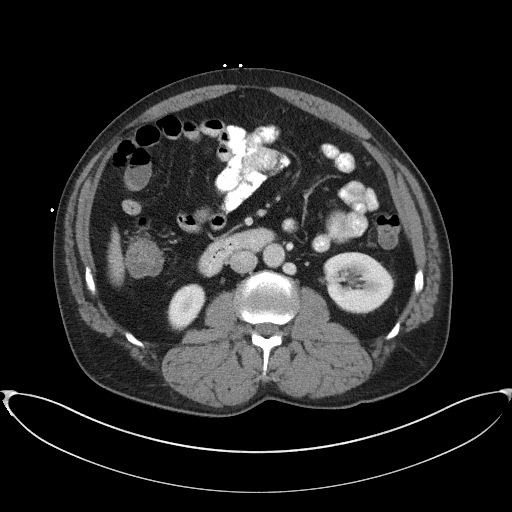
[im 71/99  soft-tissue]
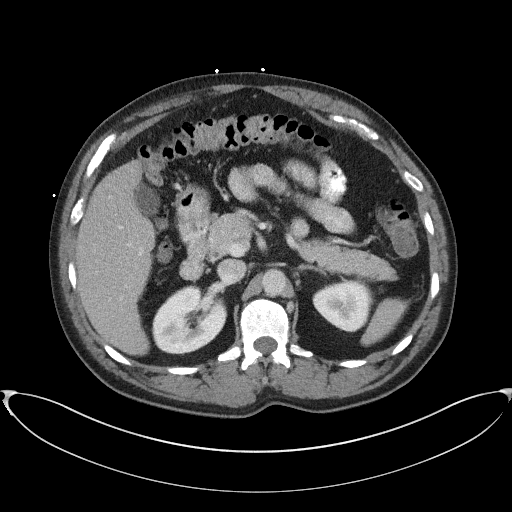
[im 71/99  bone]
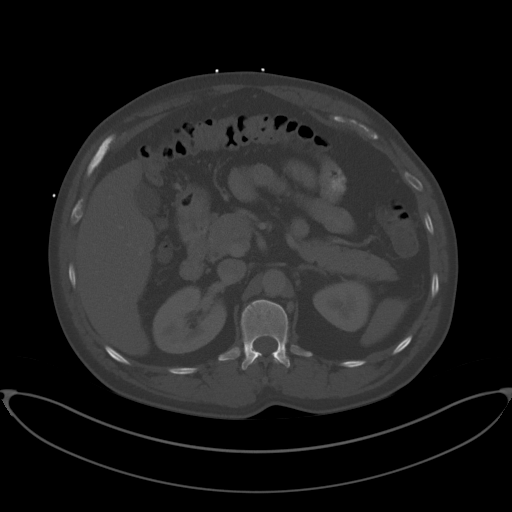
[im 77/99  soft-tissue]
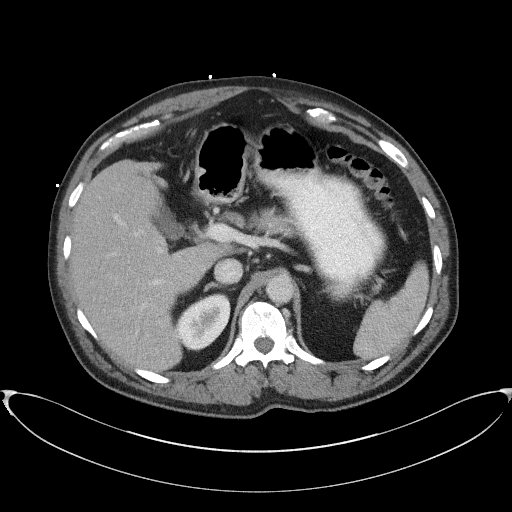
[im 82/99  soft-tissue]
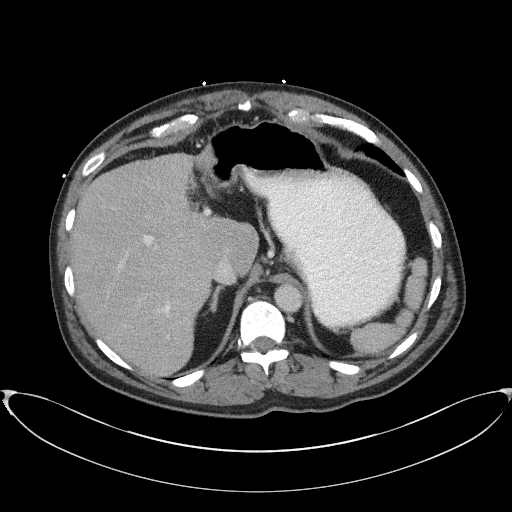
[im 93/99  soft-tissue]
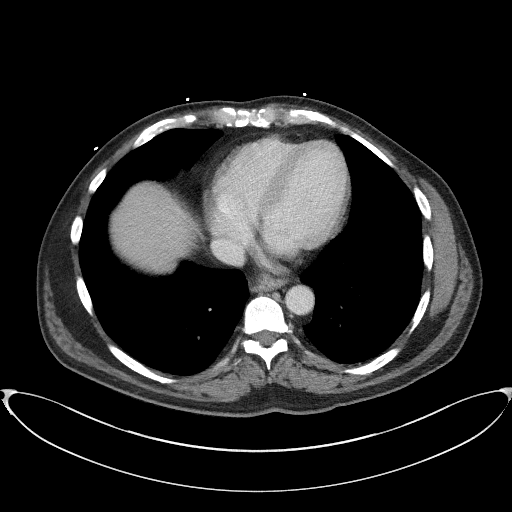

[Series 5: coronal st · coronal · 0.78mm/px · 3 of 100 slices shown]
[im 34/100  soft-tissue]
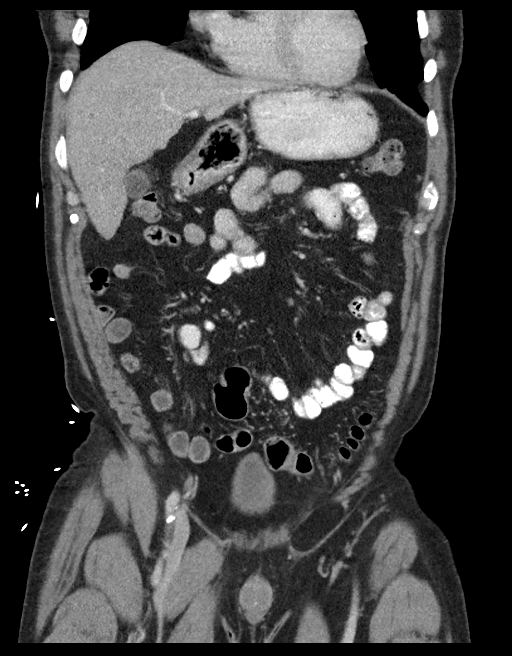
[im 45/100  soft-tissue]
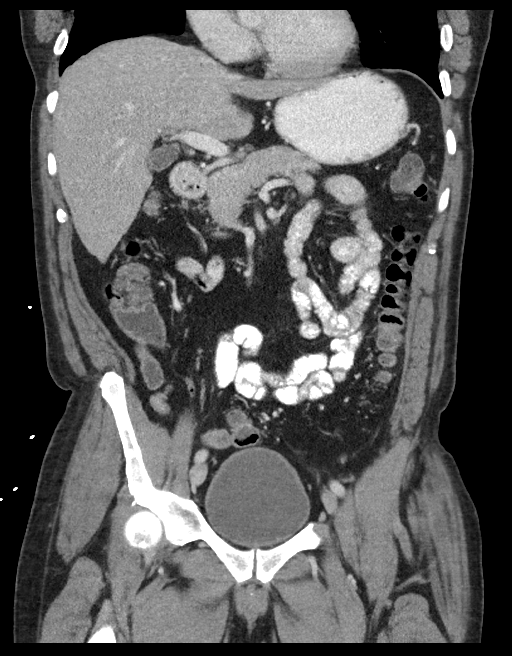
[im 56/100  soft-tissue]
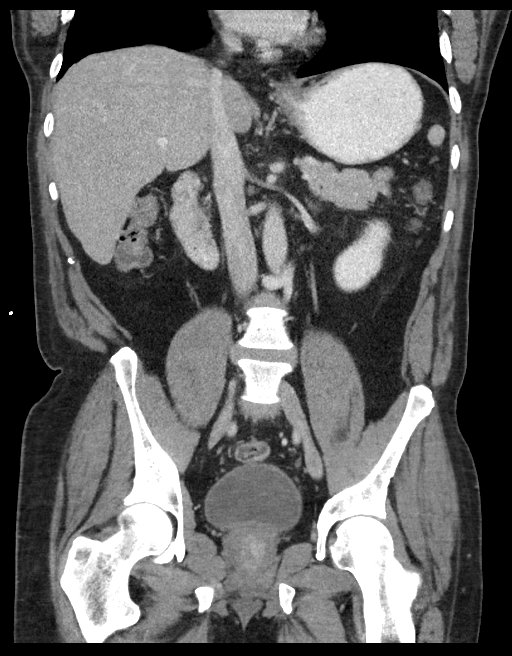

[15 of 46 positions shown; findings below may reference images not displayed]

FINDINGS: Lower chest: Mild dependent atelectasis. No pleural fluid or focal
airspace disease.

Hepatobiliary: Mild decreased hepatic density suggesting steatosis.
Tiny subcentimeter hypodensity in the inferior right lobe, too small
to characterize. Gallbladder physiologically distended, no calcified
stone. No biliary dilatation.

Pancreas: No ductal dilatation or inflammation.

Spleen: Normal in size without focal abnormality.

Adrenals/Urinary Tract: Normal adrenal glands. No hydronephrosis or
perinephric edema. Small cyst in the posterior right kidney. Ureters
are decompressed. Urinary bladder is physiologically distended.
Possible wall thickening at the dome.

Stomach/Bowel: Minimal enteric contrast in the distal esophagus. No
esophageal wall thickening. Stomach physiologically distended. No
small bowel obstruction or inflammation. Normal appendix. Colon is
relatively decompressed, possible mild wall thickening involving the
distal descending colon. No definite perienteric inflammation. No
significant diverticular disease.

Vascular/Lymphatic: There is a circumaortic left renal vein. Normal
caliber abdominal aorta. Trace atherosclerosis distally. No
adenopathy.

Reproductive: Prominent prostate gland causing mass effect on the
bladder base.

Other: Fat within the left inguinal canal. No free air, free fluid,
or intra-abdominal fluid collection.

Musculoskeletal: Degenerative disc disease at L1-L2. There are no
acute or suspicious osseous abnormalities.
IMPRESSION: 1. Findings equivocal for colonic wall thickening versus
nondistention of the distal descending colon, possible mild colitis.
2. Minimal enteric contrast in the distal esophagus can be seen with
reflux or slow transit. No esophageal thickening or findings to
explain hematemesis.
3. Incidental findings of enlarged prostate gland. Suspect mild
bladder dome thickening which may be chronic and secondary to
chronic bladder outlet obstruction. Small fat containing left
inguinal hernia.

## 2017-06-04 IMAGING — CR DG LUMBAR SPINE COMPLETE 4+V
1 series · 5 of 5 positions shown · non-contrast
Comparison: None.

CLINICAL DATA: Low right back pain radiating to hip for 1 day.
Unable to walk.

EXAM:
LUMBAR SPINE - COMPLETE 4+ VIEW

[Series 1: dg lumbar spine complete 4 +v · 0.14mm/px · 5 of 5 slices shown]
[im 1/5]
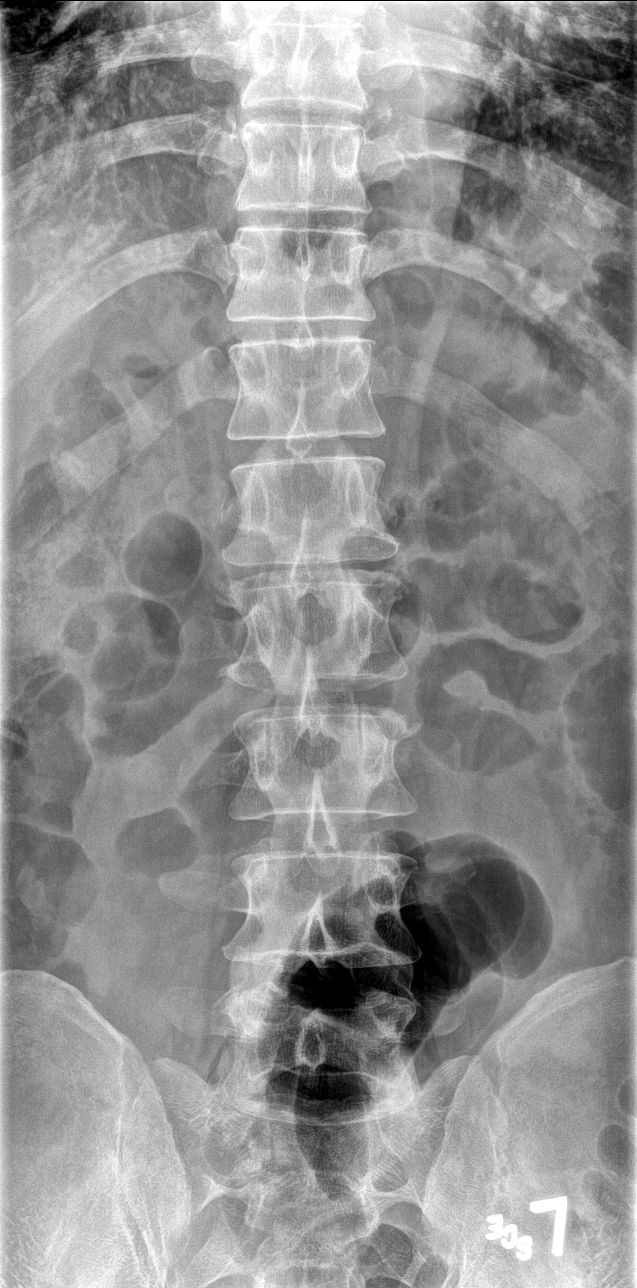
[im 2/5]
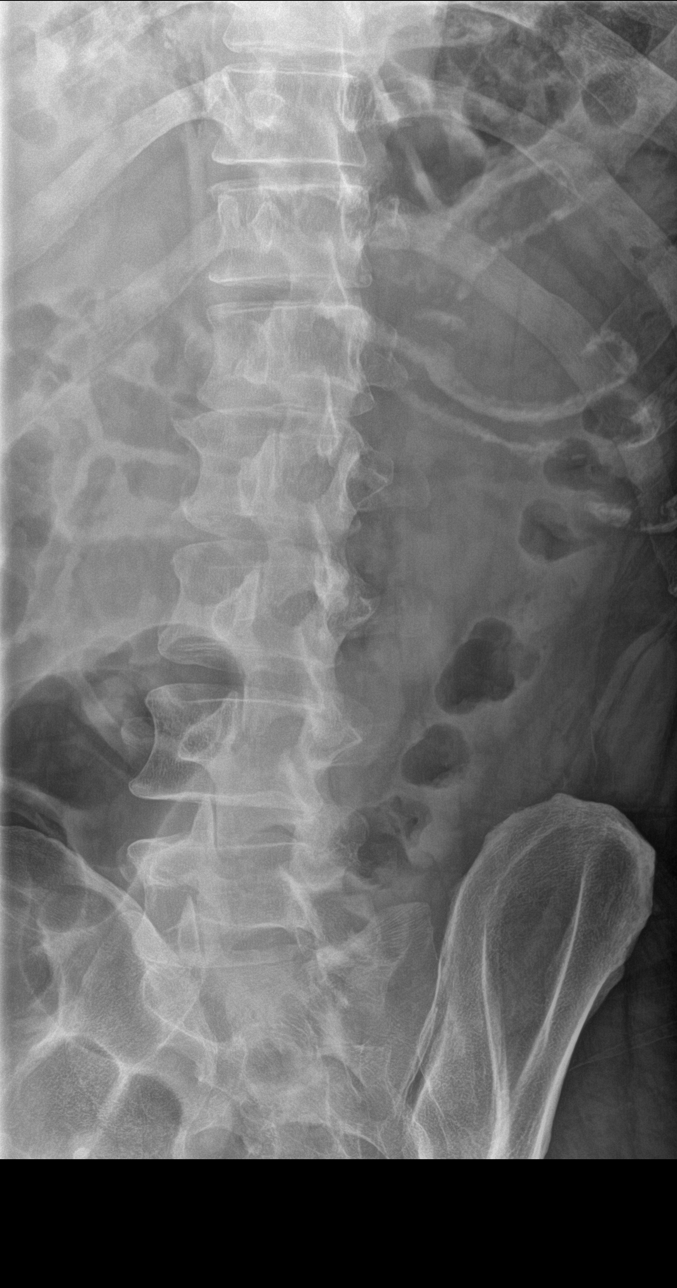
[im 3/5]
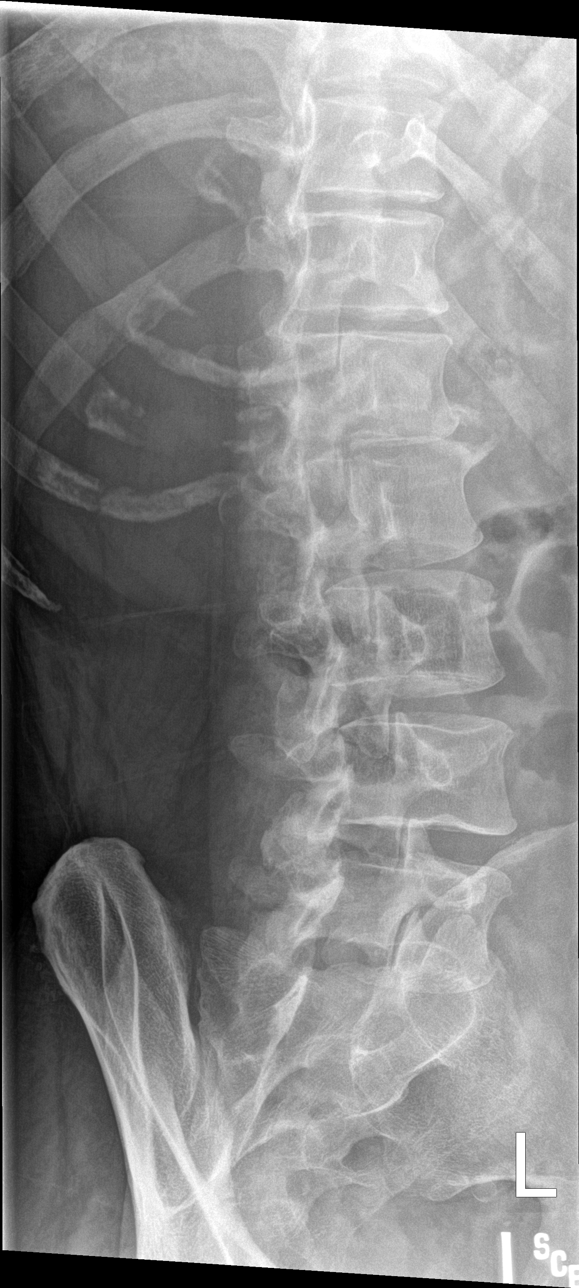
[im 4/5]
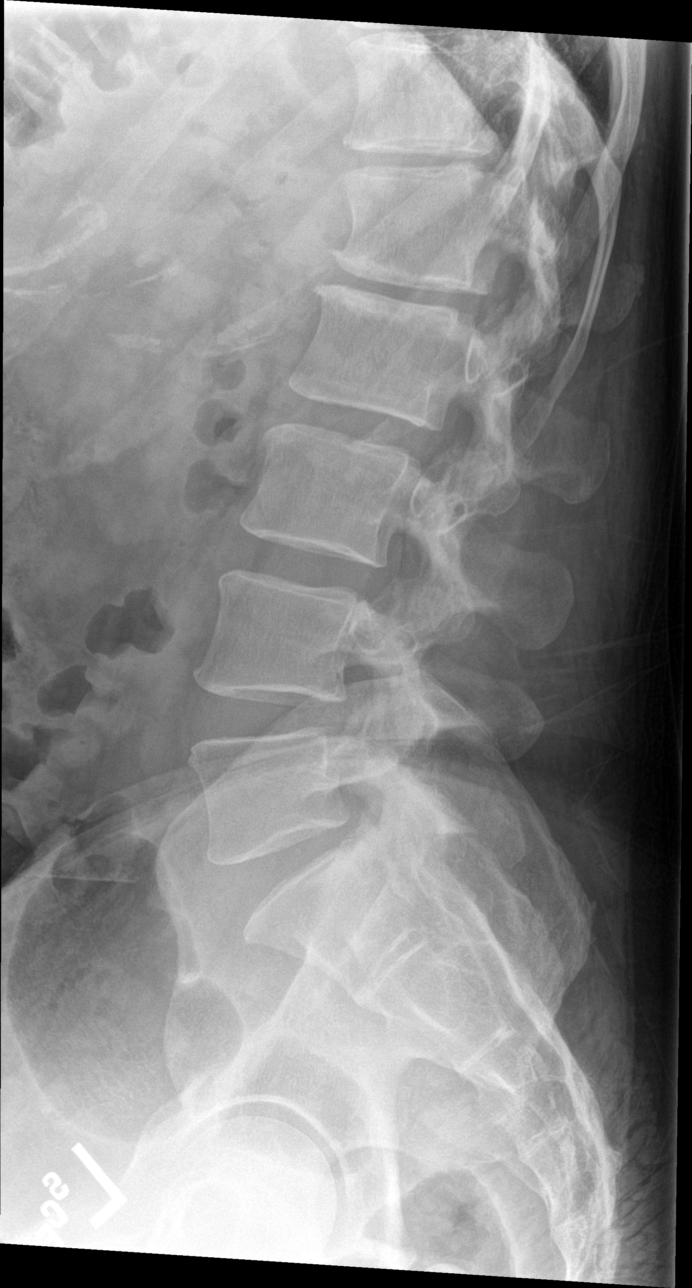
[im 5/5]
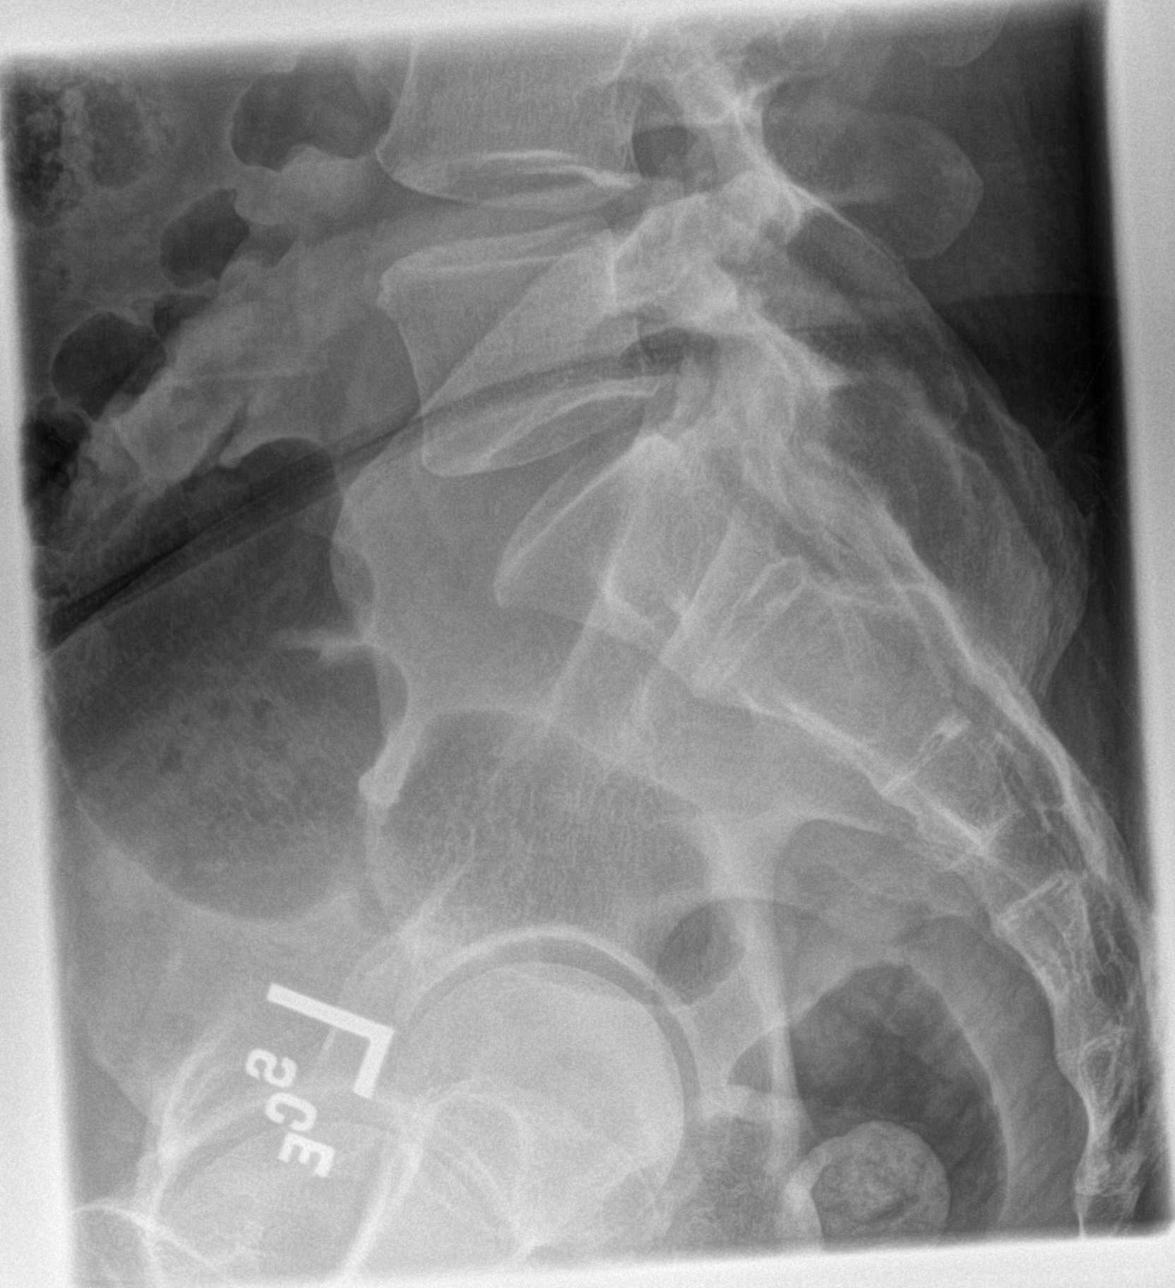

[5 of 5 positions shown; findings below may reference images not displayed]

FINDINGS: There is a mild levoscoliosis which may be accentuated by patient
positioning. Alignment is otherwise normal. Bone mineralization is
normal. No fracture line or displaced fracture fragment seen. No
acute or suspicious osseous lesion. No evidence of pars
interarticularis defect.

Mild degenerative spurring within the upper lumbar spine. No
evidence of advanced degenerative change at any level. Upper sacrum
appears intact and normally aligned. Visualized paravertebral soft
tissues are unremarkable.
IMPRESSION: No acute findings. Mild degenerative change in the upper lumbar
spine. Mild scoliosis which may be accentuated by patient
positioning.
